# Patient Record
Sex: Female | Born: 1940 | Race: White | Hispanic: No | Marital: Married | Smoking: Never smoker
Health system: Southern US, Community
[De-identification: ages and names within clinical notes are randomized; demographics above are authoritative.]

## PROBLEM LIST (undated history)

## (undated) DIAGNOSIS — E7800 Pure hypercholesterolemia, unspecified: Secondary | ICD-10-CM

## (undated) DIAGNOSIS — M1990 Unspecified osteoarthritis, unspecified site: Secondary | ICD-10-CM

## (undated) HISTORY — PX: BREAST CYST EXCISION: SHX579

---

## 2010-07-19 ENCOUNTER — Ambulatory Visit: Payer: Self-pay | Admitting: Unknown Physician Specialty

## 2010-09-19 ENCOUNTER — Ambulatory Visit: Payer: Self-pay | Admitting: Unknown Physician Specialty

## 2016-07-29 ENCOUNTER — Other Ambulatory Visit: Payer: Self-pay | Admitting: Nurse Practitioner

## 2016-07-29 DIAGNOSIS — Z1231 Encounter for screening mammogram for malignant neoplasm of breast: Secondary | ICD-10-CM

## 2019-10-27 ENCOUNTER — Other Ambulatory Visit: Payer: Self-pay | Admitting: Nurse Practitioner

## 2019-10-27 DIAGNOSIS — Z1231 Encounter for screening mammogram for malignant neoplasm of breast: Secondary | ICD-10-CM

## 2019-12-27 ENCOUNTER — Other Ambulatory Visit: Payer: Self-pay

## 2019-12-27 ENCOUNTER — Encounter (INDEPENDENT_AMBULATORY_CARE_PROVIDER_SITE_OTHER): Payer: Self-pay

## 2019-12-27 ENCOUNTER — Ambulatory Visit
Admission: RE | Admit: 2019-12-27 | Discharge: 2019-12-27 | Disposition: A | Discharge status: Home / Self Care | Payer: Medicare Other | Source: Ambulatory Visit | Attending: Nurse Practitioner | Admitting: Nurse Practitioner

## 2019-12-27 DIAGNOSIS — Z1231 Encounter for screening mammogram for malignant neoplasm of breast: Secondary | ICD-10-CM | POA: Diagnosis not present

## 2019-12-29 ENCOUNTER — Other Ambulatory Visit: Payer: Self-pay | Admitting: *Deleted

## 2019-12-29 ENCOUNTER — Inpatient Hospital Stay
Admission: RE | Admit: 2019-12-29 | Discharge: 2019-12-29 | Disposition: A | Discharge status: Home / Self Care | Payer: Self-pay | Source: Ambulatory Visit | Attending: *Deleted | Admitting: *Deleted

## 2019-12-29 DIAGNOSIS — Z1231 Encounter for screening mammogram for malignant neoplasm of breast: Secondary | ICD-10-CM

## 2021-01-02 ENCOUNTER — Ambulatory Visit
Admission: EM | Admit: 2021-01-02 | Discharge: 2021-01-02 | Disposition: A | Discharge status: Home / Self Care | Payer: Medicare Other | Attending: Family Medicine | Admitting: Family Medicine

## 2021-01-02 ENCOUNTER — Other Ambulatory Visit: Payer: Self-pay

## 2021-01-02 DIAGNOSIS — L03011 Cellulitis of right finger: Secondary | ICD-10-CM

## 2021-01-02 HISTORY — DX: Unspecified osteoarthritis, unspecified site: M19.90

## 2021-01-02 HISTORY — DX: Pure hypercholesterolemia, unspecified: E78.00

## 2021-01-02 MED ORDER — HYDROCODONE-ACETAMINOPHEN 5-325 MG PO TABS: 1.0000 | ORAL_TABLET | Freq: Three times a day (TID) | ORAL | 0 refills | Status: AC | PRN

## 2021-01-02 MED ORDER — DOXYCYCLINE HYCLATE 100 MG PO CAPS: 100.0000 mg | ORAL_CAPSULE | Freq: Two times a day (BID) | ORAL | 0 refills | Status: AC

## 2021-01-02 NOTE — ED Triage Notes (Signed)
Pt reports about 4 days ago she shut her finger in a drawer. Right index finger swollen and reddened and has a blackened area near nail. Pt saw her PCP yesterday and had xray yesterday and was negative for fx. Told to take her regular pain medicine but was helping initially but now is throbbing.

## 2021-01-02 NOTE — ED Notes (Signed)
Non stick dressing and coban applied over incision site

## 2021-01-02 NOTE — ED Provider Notes (Signed)
[facility]    CSN: [id number] Arrival date & time: 01/02/21  1757      History   Chief Complaint Chief Complaint  Patient presents with  . Finger Injury   HPI  80 year old female presents with pain of the right index finger.  Patient reports that approximately 4 days ago she shut her finger in a door by accident.  Since then she has had ongoing pain.  Has been worsening.  The distal aspect of the right index finger is swollen and red.  It is exquisitely tender and painful.  She was seen by her primary care provider yesterday.  She had a negative x-ray.  She was advised to take her home pain medication and to ice the area.  She was advised supportive care.  Patient states that her pain is uncontrolled and is 10/10 in severity.  The area is throbbing and is not improving.  No fever.  No other complaints.  Past Medical History:  Diagnosis Date  . Arthritis   . High cholesterol    Past Surgical History:  Procedure Laterality Date  . BREAST CYST EXCISION Right    cyst removed over 40 yrs ago    OB History   No obstetric history on file.      Home Medications    Prior to Admission medications   Medication Sig Start Date End Date Taking? Authorizing Provider  doxycycline (VIBRAMYCIN) 100 MG capsule Take 1 capsule (100 mg total) by mouth 2 (two) times daily. 01/02/21  Yes Ahkeem Goede, Verdis Frederickson, DO  HYDROcodone-acetaminophen (NORCO/VICODIN) 5-325 MG tablet Take 1 tablet by mouth every 8 (eight) hours as needed for severe pain. 01/02/21  Yes Nika Yazzie G, DO  diclofenac (VOLTAREN) 75 MG EC tablet Take 75 mg by mouth 2 (two) times daily. 10/06/20   [provider]  lovastatin (MEVACOR) 20 MG tablet Take 20 mg by mouth daily. 01/01/21   [provider]  Multiple Vitamins-Minerals (VITRUM SENIOR) TABS Take 1 tablet by mouth daily.    [provider]  omeprazole (PRILOSEC) 20 MG capsule Take 1 capsule by mouth daily. 01/01/21   [provider]   traMADol (ULTRAM) 50 MG tablet Take 50 mg by mouth 2 (two) times daily as needed. 01/01/21   [provider]    Family History Family History  Problem Relation Age of Onset  . Breast cancer Neg Hx     Social History Social History   Tobacco Use  . Smoking status: Never Smoker  . Smokeless tobacco: Never Used  Substance Use Topics  . Alcohol use: Yes    Alcohol/week: 1.0 standard drink    Types: 1 Glasses of wine per week    Comment: occasional  . Drug use: Never     Allergies   Other and Sulfa antibiotics   Review of Systems Review of Systems Per HPI  Physical Exam Triage Vital Signs ED Triage Vitals  Enc Vitals Group     BP 01/02/21 1810 (!) 207/88     Pulse Rate 01/02/21 1810 71     Resp 01/02/21 1810 18     Temp 01/02/21 1810 98.2 F (36.8 C)     Temp Source 01/02/21 1810 Oral     SpO2 01/02/21 1810 98 %     Weight 01/02/21 1805 197 lb (89.4 kg)     Height 01/02/21 1805 5\' 5"  (1.651 m)     Head Circumference --      Peak Flow --  Pain Score 01/02/21 1805 10     Pain Loc --      Pain Edu? --      Excl. in [place]? --    No data found.  Updated Vital Signs BP (!) 207/88 (BP Location: Left Arm)   Pulse 71   Temp 98.2 F (36.8 C) (Oral)   Resp 18   Ht 5\' 5"  (1.651 m)   Wt 89.4 kg   SpO2 98%   BMI 32.78 kg/m   Visual Acuity Right Eye Distance:   Left Eye Distance:   Bilateral Distance:    Right Eye Near:   Left Eye Near:    Bilateral Near:     Physical Exam Vitals and nursing note reviewed.  Constitutional:      General: She is not in acute distress.    Appearance: Normal appearance.  HENT:     Head: Normocephalic and atraumatic.  Eyes:     General:        Right eye: No discharge.        Left eye: No discharge.     Conjunctiva/sclera: Conjunctivae normal.  Skin:    Comments: Right index finger -swelling noted of the distal finger.  Erythema noted.  Patient also has a collection of purulent fluid around the nailbed.  This  is consistent with a paronychia.  Neurological:     Mental Status: She is alert.  Psychiatric:        Mood and Affect: Mood normal.        Behavior: Behavior normal.    UC Treatments / Results  Labs (all labs ordered are listed, but only abnormal results are displayed) Labs Reviewed - No data to display  EKG   Radiology No results found.  Procedures Incision and Drainage  Date/Time: 01/02/2021 7:01 PM Performed by: 01/04/2021, DO Authorized by: Tommie Sams, DO   Consent:    Consent obtained:  Verbal   Consent given by:  Patient Location:    Type:  Abscess   Location:  Upper extremity   Upper extremity location:  Finger   Finger location:  R index finger Pre-procedure details:    Skin preparation:  Povidone-iodine Anesthesia:    Anesthesia method:  Topical application   Topical anesthesia: Pain Ease. Procedure type:    Complexity:  Simple Procedure details:    Incision types:  Stab incision   Drainage:  Bloody and purulent   Drainage amount:  Moderate   Wound treatment:  Wound left open   Packing materials:  None Post-procedure details:    Procedure completion:  Tolerated with difficulty   (including critical care time)  Medications Ordered in UC Medications - No data to display  Initial Impression / Assessment and Plan / UC Course  I have reviewed the triage vital signs and the nursing notes.  Pertinent labs & imaging results that were available during my care of the patient were reviewed by me and considered in my medical decision making (see chart for details).    80 year old female presents with a paronychia.  Incision and drainage performed as above.  Patient tolerated procedure with difficulty.  Discharging home on doxycycline.  Patient in severe pain.  Sending brief course of Vicodin. Advised warm soaks.   Final Clinical Impressions(s) / UC Diagnoses   Final diagnoses:  Paronychia of finger of right hand     Discharge Instructions      Warm soaks.  Medication as prescribed.  If you worsen, go to the  hospital.  Take care  Dr. Adriana Simas    ED Prescriptions    Medication Sig Dispense Auth. Provider   HYDROcodone-acetaminophen (NORCO/VICODIN) 5-325 MG tablet Take 1 tablet by mouth every 8 (eight) hours as needed for severe pain. 6 tablet Maddix Kliewer G, DO   doxycycline (VIBRAMYCIN) 100 MG capsule Take 1 capsule (100 mg total) by mouth 2 (two) times daily. 20 capsule Everlene Other G, DO     I have reviewed the PDMP during this encounter.   Tommie Sams, [place] 01/02/21 1903

## 2021-01-02 NOTE — Discharge Instructions (Signed)
Warm soaks.  Medication as prescribed.  If you worsen, go to the hospital.  Take care  Dr. Adriana Simas

## 2022-01-07 ENCOUNTER — Other Ambulatory Visit: Payer: Self-pay | Admitting: Nurse Practitioner

## 2022-01-07 DIAGNOSIS — Z1231 Encounter for screening mammogram for malignant neoplasm of breast: Secondary | ICD-10-CM

## 2022-01-14 ENCOUNTER — Ambulatory Visit
Admission: RE | Admit: 2022-01-14 | Discharge: 2022-01-14 | Disposition: A | Discharge status: Home / Self Care | Payer: Medicare Other | Source: Ambulatory Visit | Attending: Nurse Practitioner | Admitting: Nurse Practitioner

## 2022-01-14 DIAGNOSIS — Z1231 Encounter for screening mammogram for malignant neoplasm of breast: Secondary | ICD-10-CM | POA: Insufficient documentation

## 2022-07-02 IMAGING — MG MM DIGITAL SCREENING BILAT W/ TOMO AND CAD
8 series · 8 of 24 positions shown · non-contrast
Comparison: Previous exam(s).

CLINICAL DATA: Screening.

EXAM:
DIGITAL SCREENING BILATERAL MAMMOGRAM WITH TOMOSYNTHESIS AND CAD
TECHNIQUE: Bilateral screening digital craniocaudal and mediolateral oblique
mammograms were obtained. Bilateral screening digital breast
tomosynthesis was performed. The images were evaluated with
computer-aided detection.

[R MLO synth-2D]
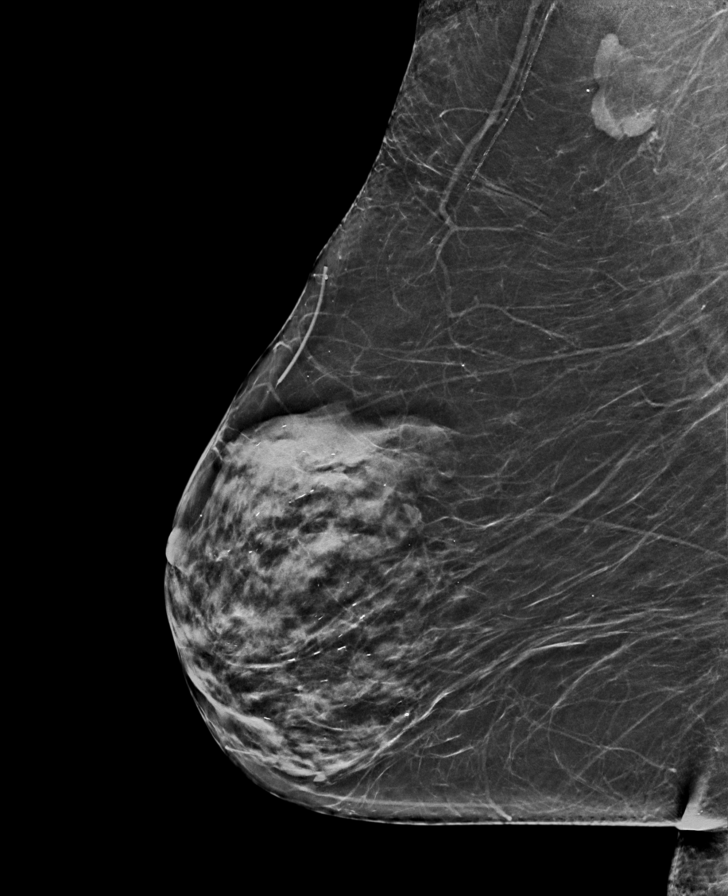

[R CC synth-2D]
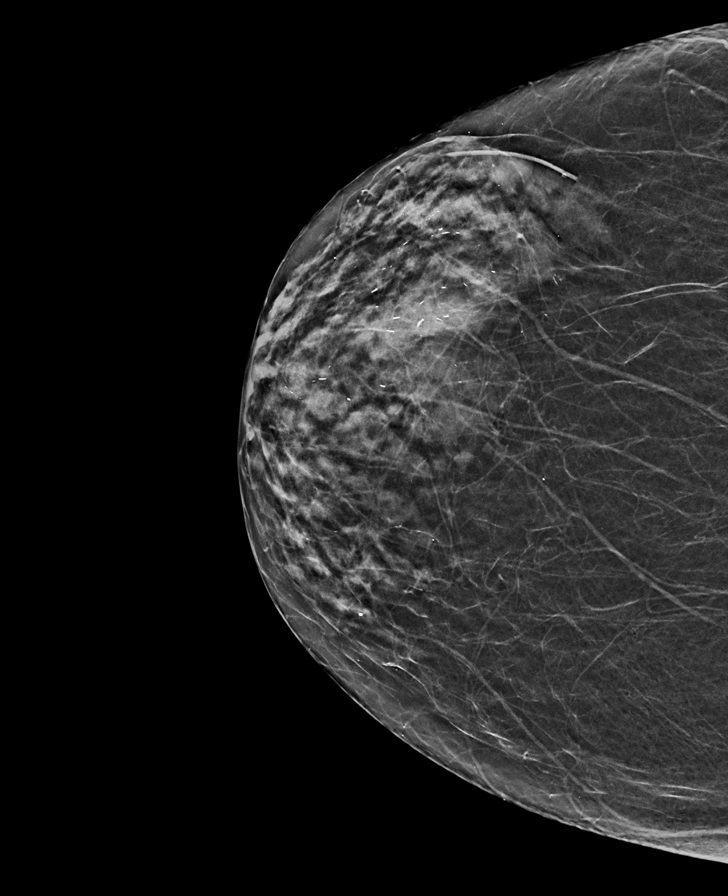

[L CC synth-2D]
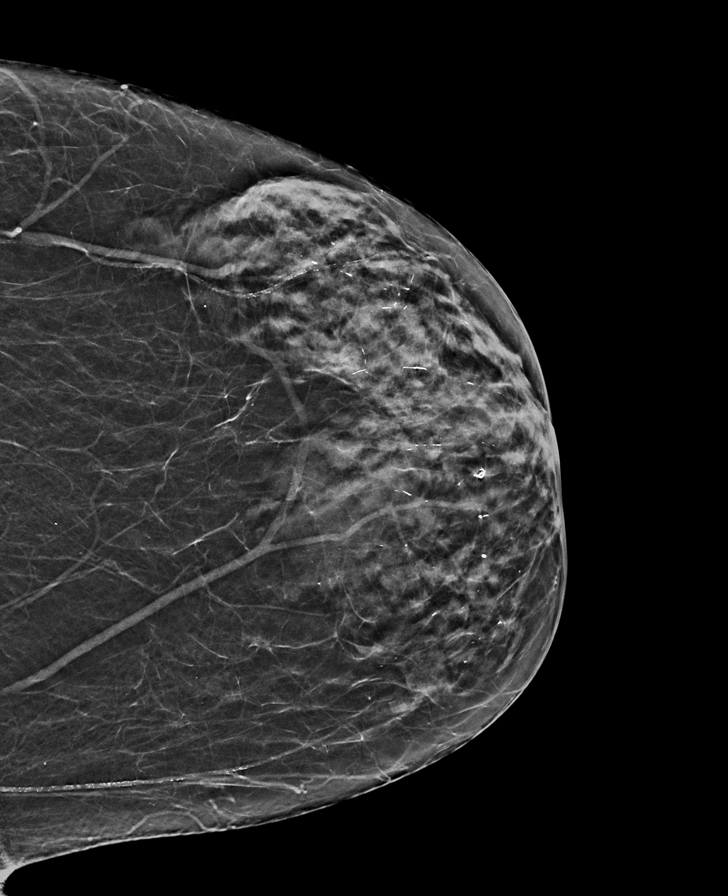

[L MLO synth-2D]
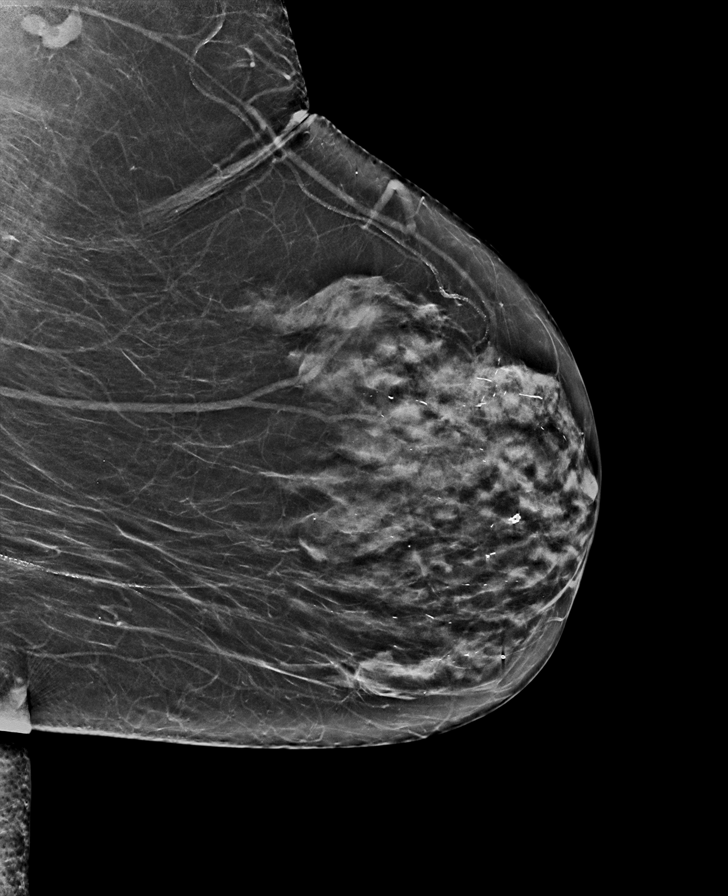

[R MLO tomo · tomo slice 33/65.0]
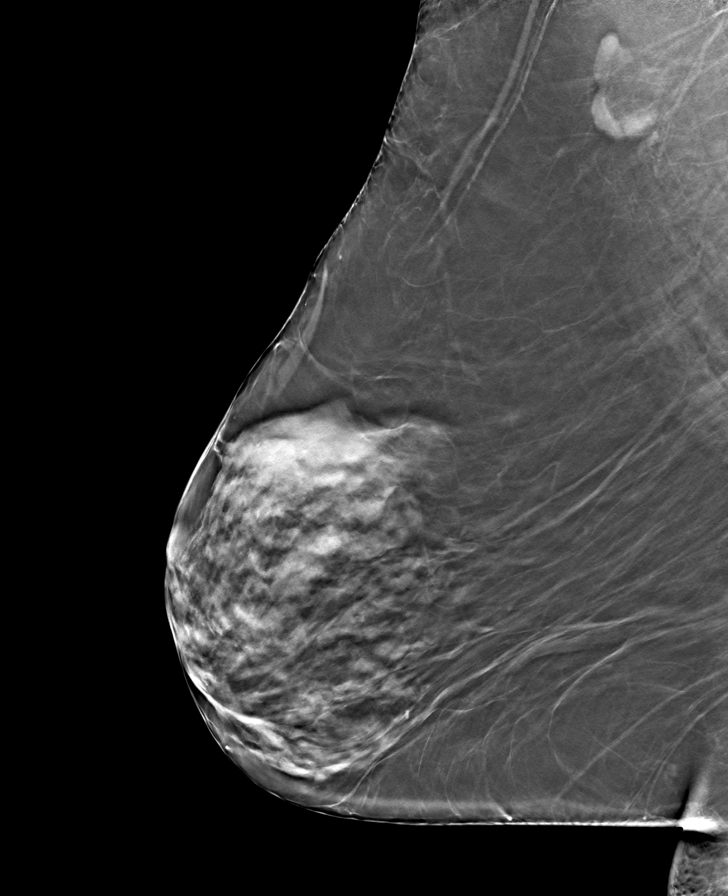

[L MLO tomo · tomo slice 33/66.0]
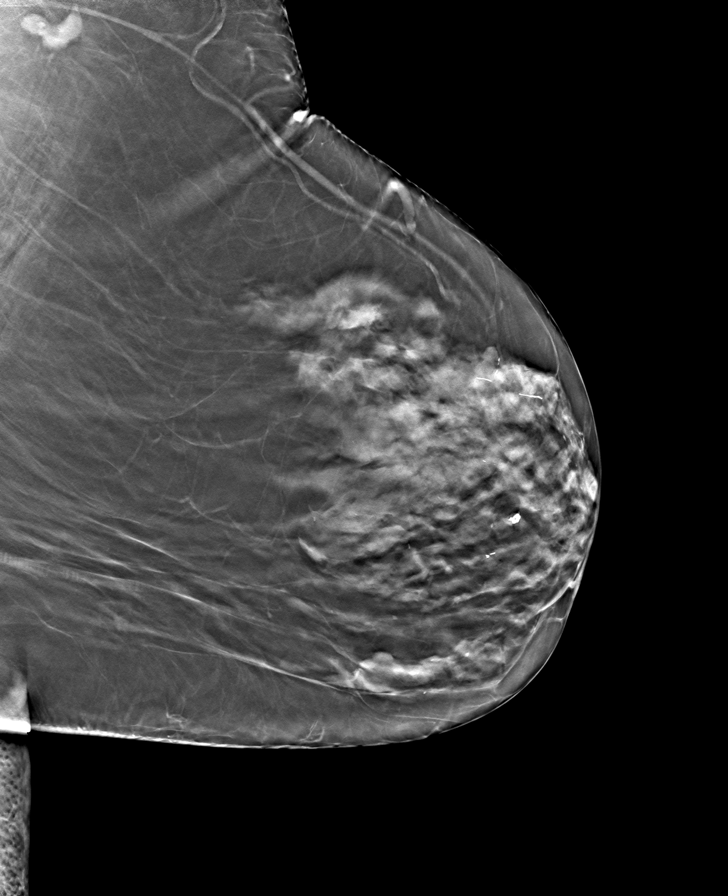

[L CC tomo · tomo slice 27/53.0]
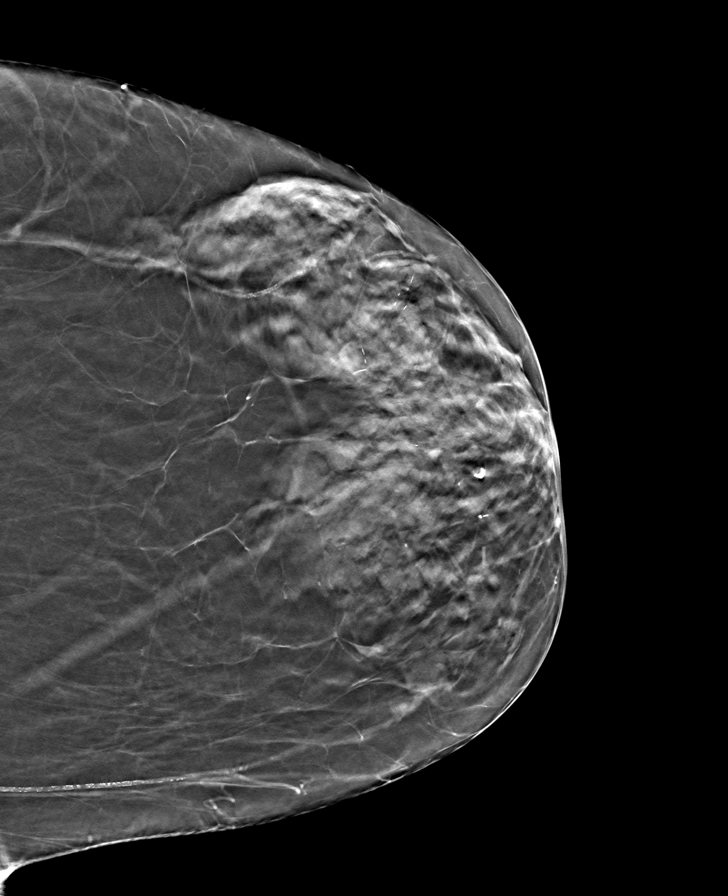

[R CC tomo · tomo slice 27/53.0]
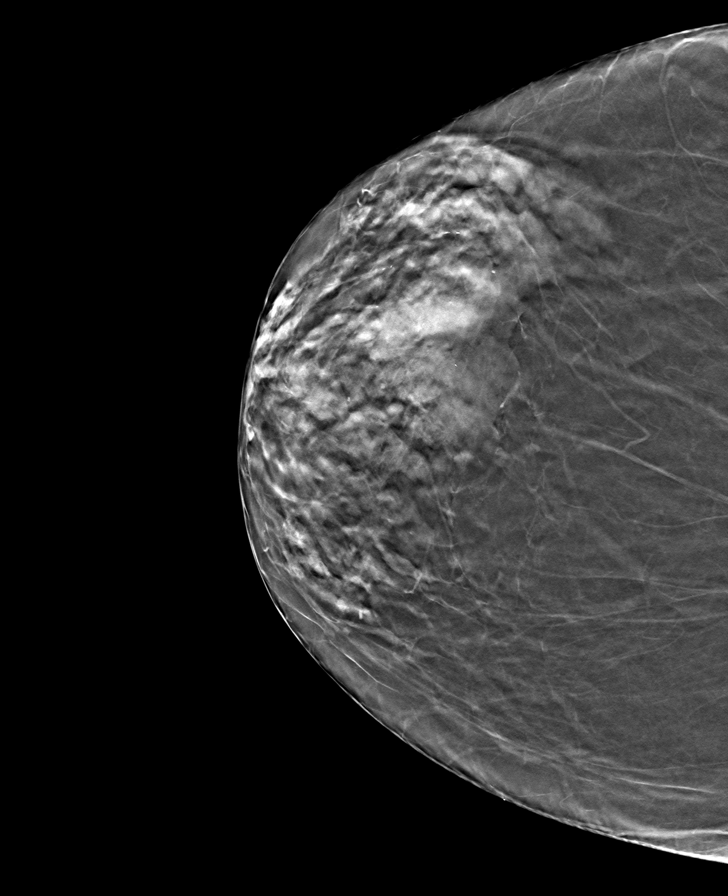

[8 of 24 positions shown; findings below may reference images not displayed]

ACR Breast Density Category c: The breast tissue is heterogeneously
dense, which may obscure small masses.
FINDINGS: There are no findings suspicious for malignancy.
IMPRESSION: No mammographic evidence of malignancy. A result letter of this
screening mammogram will be mailed directly to the patient.

RECOMMENDATION:
Screening mammogram in one year. (Code:Q3-W-BC3)

BI-RADS CATEGORY  1: Negative.

## 2022-10-26 ENCOUNTER — Ambulatory Visit
Admission: EM | Admit: 2022-10-26 | Discharge: 2022-10-26 | Disposition: A | Discharge status: Home / Self Care | Payer: Medicare Other

## 2022-10-26 DIAGNOSIS — S161XXA Strain of muscle, fascia and tendon at neck level, initial encounter: Secondary | ICD-10-CM

## 2022-10-26 DIAGNOSIS — M62838 Other muscle spasm: Secondary | ICD-10-CM | POA: Diagnosis not present

## 2022-10-26 DIAGNOSIS — M542 Cervicalgia: Secondary | ICD-10-CM

## 2022-10-26 MED ORDER — TIZANIDINE HCL 4 MG PO TABS: 4.0000 mg | ORAL_TABLET | Freq: Three times a day (TID) | ORAL | 0 refills | Status: AC | PRN

## 2022-10-26 NOTE — Discharge Instructions (Signed)
NECK PAIN: Stressed avoiding painful activities. This can exacerbate your symptoms and make them worse.  May apply heat to the areas of pain for some relief. Use medications as directed. Be aware of which medications make you drowsy and do not drive or operate any kind of heavy machinery while using the medication (ie pain medications or muscle relaxers). F/U with PCP for reexamination or return sooner if condition worsens or does not begin to improve over the next few days.   NECK PAIN RED FLAGS: If symptoms get worse than they are right now, you should come back sooner for re-evaluation. If you have increased numbness/ tingling or notice that the numbness/tingling is affecting the legs or saddle region, go to ER. If you ever lose continence go to ER.

## 2022-10-26 NOTE — ED Triage Notes (Signed)
Pt can lift her left arm but states that it is painful.   Pt has to lift herself from sitting with both arms but states it is painful and she can not push with her left arm.

## 2022-10-26 NOTE — ED Triage Notes (Signed)
Pt is with her husband and daughter  Pt c/o left shoulder pain x1day  Pt takes tramadol and arthritis tylenol and it does not help.   Pt states that she can not turn her head toward the left.   Pt does not know what caused the pain.

## 2022-10-26 NOTE — ED Provider Notes (Signed)
[facility]    CSN: [id number] Arrival date & time: 10/26/22  1142      History   Chief Complaint Chief Complaint  Patient presents with   Shoulder Pain    HPI Ebony Becker is a 82 y.o. female presenting with her family members for left sided neck pain and stiffness for the past day.  Patient reports that she believes she turns her head the wrong way and pulled something.  She says that she cannot look toward the left side if she gets a sharp pain in her neck.  The pain does not radiate down her arm and she does not have any numbness, weakness or tingling.  She denies any pain into her chest, palpitations, shortness of breath.  Patient has history of arthritis. She regularly takes Tylenol 650 mg BID and Ultram BID as well as applies voltaren gel to knees. She has signed a pain contract with [facility].  Patient says her regular medications as well as ice, heat, muscle rubs have not been helpful for her neck pain.  No other complaints.  HPI  Past Medical History:  Diagnosis Date   Arthritis    High cholesterol     There are no problems to display for this patient.   Past Surgical History:  Procedure Laterality Date   BREAST CYST EXCISION Right    cyst removed over 40 yrs ago    OB History   No obstetric history on file.      Home Medications    Prior to Admission medications   Medication Sig Start Date End Date Taking? Authorizing Provider  acetaminophen (TYLENOL) 500 MG tablet Take by mouth. 02/24/21  Yes [provider]  diclofenac (VOLTAREN) 75 MG EC tablet Take 75 mg by mouth 2 (two) times daily. 10/06/20  Yes [provider]  doxycycline (VIBRAMYCIN) 100 MG capsule Take 1 capsule (100 mg total) by mouth 2 (two) times daily. 01/02/21  Yes Cook, Jayce G, DO  ELIQUIS 5 MG TABS tablet Take 5 mg by mouth 2 (two) times daily.   Yes [provider]  HYDROcodone-acetaminophen (NORCO/VICODIN) 5-325 MG tablet Take 1 tablet by  mouth every 8 (eight) hours as needed for severe pain. 01/02/21  Yes Cook, Jayce G, DO  losartan (COZAAR) 50 MG tablet Take 50 mg by mouth every morning.   Yes [provider]  lovastatin (MEVACOR) 20 MG tablet Take 20 mg by mouth daily. 01/01/21  Yes [provider]  Multiple Vitamins-Minerals (VITRUM SENIOR) TABS Take 1 tablet by mouth daily.   Yes [provider]  omeprazole (PRILOSEC) 20 MG capsule Take 1 capsule by mouth daily. 01/01/21  Yes [provider]  tiZANidine (ZANAFLEX) 4 MG tablet Take 1 tablet (4 mg total) by mouth every 8 (eight) hours as needed for up to 7 days. 10/26/22 11/02/22 Yes Danton Clap, PA-C  traMADol (ULTRAM) 50 MG tablet Take 50 mg by mouth 2 (two) times daily as needed. 01/01/21  Yes [provider]    Family History Family History  Problem Relation Age of Onset   Breast cancer Neg Hx     Social History Social History   Tobacco Use   Smoking status: Never   Smokeless tobacco: Never  Vaping Use   Vaping Use: Never used  Substance Use Topics   Alcohol use: Yes    Alcohol/week: 1.0 standard drink of alcohol    Types: 1 Glasses of wine per week    Comment: occasional  Drug use: Never     Allergies   Other and Sulfa antibiotics   Review of Systems Review of Systems  Respiratory:  Negative for shortness of breath.   Cardiovascular:  Negative for chest pain and palpitations.  Musculoskeletal:  Positive for arthralgias, neck pain and neck stiffness. Negative for back pain and joint swelling.  Skin:  Negative for rash and wound.  Neurological:  Negative for weakness and numbness.     Physical Exam Triage Vital Signs ED Triage Vitals  Enc Vitals Group     BP 10/26/22 1247 (!) 181/95     Pulse Rate 10/26/22 1247 78     Resp --      Temp 10/26/22 1247 98.3 F (36.8 C)     Temp Source 10/26/22 1247 Oral     SpO2 10/26/22 1247 98 %     Weight 10/26/22 1244 190 lb (86.2 kg)     Height 10/26/22 1244  5' 5"$  (1.651 m)     Head Circumference --      Peak Flow --      Pain Score 10/26/22 1243 10     Pain Loc --      Pain Edu? --      Excl. in [place]? --    No data found.  Updated Vital Signs BP (!) 179/102 (BP Location: Right Arm)   Pulse 78   Temp 98.3 F (36.8 C) (Oral)   Ht 5' 5"$  (1.651 m)   Wt 190 lb (86.2 kg)   SpO2 98%   BMI 31.62 kg/m      Physical Exam Vitals and nursing note reviewed.  Constitutional:      General: She is not in acute distress.    Appearance: Normal appearance. She is not ill-appearing or toxic-appearing.  HENT:     Head: Normocephalic and atraumatic.  Eyes:     General: No scleral icterus.       Right eye: No discharge.        Left eye: No discharge.     Conjunctiva/sclera: Conjunctivae normal.  Cardiovascular:     Rate and Rhythm: Normal rate and regular rhythm.     Heart sounds: Normal heart sounds.  Pulmonary:     Effort: Pulmonary effort is normal. No respiratory distress.     Breath sounds: Normal breath sounds.  Musculoskeletal:     Cervical back: Neck supple. Spasms and tenderness (TTP left paracervical muscles. and left trapezius muscle) present. No deformity or rigidity. Pain with movement present. Decreased range of motion.     Comments: Full strength upper extremities.   Skin:    General: Skin is dry.  Neurological:     General: No focal deficit present.     Mental Status: She is alert. Mental status is at baseline.     Motor: No weakness.     Gait: Gait normal.  Psychiatric:        Mood and Affect: Mood normal.        Behavior: Behavior normal.        Thought Content: Thought content normal.      UC Treatments / Results  Labs (all labs ordered are listed, but only abnormal results are displayed) Labs Reviewed - No data to display  EKG   Radiology No results found.  Procedures Procedures (including critical care time)  Medications Ordered in UC Medications - No data to display  Initial Impression / Assessment  and Plan / UC Course  I have reviewed the triage  vital signs and the nursing notes.  Pertinent labs & imaging results that were available during my care of the patient were reviewed by me and considered in my medical decision making (see chart for details).   82 y/o female presents for left sided neck pain since yesterday. Denies injury. No radiation of pain. No numbness/weakness/tingling. No CP, SOB. Taking Tylenol, Ultram, and using heat/ice and muscle rubs without relief.   On exam patient has limited range of motion of her neck especially with flexion, extension and rotation to the left.  Tenderness throughout the left paracervical muscles and left trapezius muscle.  Muscles are stiff in this region.  Chest clear to auscultation heart regular rate and rhythm.  Cervical strain/sprain and muscle spasms.  Advised continuing Ultram and Tylenol as well as heat, ice, muscle rubs.  Will start tizanidine as needed for muscle spasms.  Advised her to follow-up with PCP as they prescribe her chronic pain medication.  If she needs stronger pain medication she will need to reach out to PCP says she has signed a pain contract.  ED for any acute worsening of symptoms or red flag signs/symptoms which were outlined in the handout.   Final Clinical Impressions(s) / UC Diagnoses   Final diagnoses:  Neck pain  Strain of neck muscle, initial encounter  Muscle spasm     Discharge Instructions      NECK PAIN: Stressed avoiding painful activities. This can exacerbate your symptoms and make them worse.  May apply heat to the areas of pain for some relief. Use medications as directed. Be aware of which medications make you drowsy and do not drive or operate any kind of heavy machinery while using the medication (ie pain medications or muscle relaxers). F/U with PCP for reexamination or return sooner if condition worsens or does not begin to improve over the next few days.   NECK PAIN RED FLAGS: If symptoms get  worse than they are right now, you should come back sooner for re-evaluation. If you have increased numbness/ tingling or notice that the numbness/tingling is affecting the legs or saddle region, go to ER. If you ever lose continence go to ER.         ED Prescriptions     Medication Sig Dispense Auth. Provider   tiZANidine (ZANAFLEX) 4 MG tablet Take 1 tablet (4 mg total) by mouth every 8 (eight) hours as needed for up to 7 days. 20 tablet Danton Clap, PA-C      I have reviewed the PDMP during this encounter.   Danton Clap, PA-C 10/26/22 1401

## 2023-01-10 ENCOUNTER — Other Ambulatory Visit: Payer: Self-pay | Admitting: Nurse Practitioner

## 2023-01-10 DIAGNOSIS — Z1231 Encounter for screening mammogram for malignant neoplasm of breast: Secondary | ICD-10-CM

## 2023-03-06 ENCOUNTER — Ambulatory Visit
Admission: RE | Admit: 2023-03-06 | Discharge: 2023-03-06 | Disposition: A | Discharge status: Home / Self Care | Payer: Medicare Other | Source: Ambulatory Visit | Attending: Nurse Practitioner | Admitting: Nurse Practitioner

## 2023-03-06 DIAGNOSIS — Z1231 Encounter for screening mammogram for malignant neoplasm of breast: Secondary | ICD-10-CM | POA: Insufficient documentation
